# Patient Record
Sex: Female | Born: 1960 | Race: White | Hispanic: No | Marital: Married | Smoking: Never smoker
Health system: Southern US, Community
[De-identification: ages and names within clinical notes are randomized; demographics above are authoritative.]

## PROBLEM LIST (undated history)

## (undated) HISTORY — PX: ABDOMINAL HYSTERECTOMY: SHX81

---

## 2019-11-23 ENCOUNTER — Other Ambulatory Visit: Payer: Self-pay

## 2019-11-23 ENCOUNTER — Emergency Department: Payer: BC Managed Care – PPO

## 2019-11-23 ENCOUNTER — Emergency Department
Admission: EM | Admit: 2019-11-23 | Discharge: 2019-11-23 | Disposition: A | Discharge status: Home / Self Care | Payer: BC Managed Care – PPO | Attending: Student in an Organized Health Care Education/Training Program | Admitting: Student in an Organized Health Care Education/Training Program

## 2019-11-23 ENCOUNTER — Encounter: Payer: Self-pay | Admitting: Emergency Medicine

## 2019-11-23 DIAGNOSIS — U071 COVID-19: Secondary | ICD-10-CM | POA: Diagnosis not present

## 2019-11-23 DIAGNOSIS — R05 Cough: Secondary | ICD-10-CM | POA: Diagnosis present

## 2019-11-23 HISTORY — DX: COVID-19: U07.1

## 2019-11-23 LAB — POC SARS CORONAVIRUS 2 AG: SARS Coronavirus 2 Ag: POSITIVE — AB

## 2019-11-23 MED ORDER — PSEUDOEPH-BROMPHEN-DM 30-2-10 MG/5ML PO SYRP
5.0000 mL | ORAL_SOLUTION | Freq: Four times a day (QID) | ORAL | 0 refills | Status: DC | PRN
Start: 2019-11-23 — End: 2020-02-28

## 2019-11-23 MED ORDER — AZITHROMYCIN 250 MG PO TABS
ORAL_TABLET | ORAL | 0 refills | Status: AC
Start: 2019-11-23 — End: 2019-11-28

## 2019-11-23 MED ORDER — METHYLPREDNISOLONE 4 MG PO TBPK
ORAL_TABLET | ORAL | 0 refills | Status: DC
Start: 2019-11-23 — End: 2020-02-28

## 2019-11-23 NOTE — ED Notes (Signed)
While walking patient o2 was 93-95. Reported to provider.

## 2019-11-23 NOTE — ED Notes (Signed)
This RN clicked off covid swab as collected while in room. Pt then refused when she realized it wasn't the rapid swab stating "I don't want that stuck way up in my nose". Explained that the 2hr test is more accurate but pt continued to refuse. PA states to hold on swab until results of CXR back. Will swab depending on the results per Ron PA.

## 2019-11-23 NOTE — ED Notes (Signed)
CXR staff to bedside.

## 2019-11-23 NOTE — ED Provider Notes (Signed)
[facility] Emergency Department Provider Note   ____________________________________________   First MD Initiated Contact with Patient 11/23/19 1320     (approximate)  I have reviewed the triage vital signs and the nursing notes.   HISTORY  Chief Complaint Cough    HPI Kelly Kent is a 59 y.o. female patient states increasing fatigue and cough.  Patient's spouse was recently released from the hospital secondary to COVID-19 pneumonia.  Patient states she has not been diagnosed with Covid.  Patient denies loss of taste or smell.  Patient denies sore throat.  Patient denies recent travel.  Patient has not taken Covid vaccines.          History reviewed. No pertinent past medical history.  There are no problems to display for this patient.   Past Surgical History:  Procedure Laterality Date  . ABDOMINAL HYSTERECTOMY      Prior to Admission medications   Medication Sig Start Date End Date Taking? Authorizing Provider  azithromycin (ZITHROMAX Z-PAK) 250 MG tablet Take 2 tablets (500 mg) on  Day 1,  followed by 1 tablet (250 mg) once daily on Days 2 through 5. 11/23/19 11/28/19  Sable Feil, PA-C  brompheniramine-pseudoephedrine-DM 30-2-10 MG/5ML syrup Take 5 mLs by mouth 4 (four) times daily as needed. 11/23/19   Sable Feil, PA-C  methylPREDNISolone (MEDROL DOSEPAK) 4 MG TBPK tablet Take Tapered dose as directed 11/23/19   Sable Feil, PA-C    Allergies Penicillins and Neomycin  No family history on file.  Social History Social History   Tobacco Use  . Smoking status: Never Smoker  Substance Use Topics  . Alcohol use: Yes    Comment: occasional  . Drug use: Never    Review of Systems Constitutional: Fever/chills.  Fatigue.   Eyes: No visual changes. ENT: No sore throat. Cardiovascular: Denies chest pain. Respiratory: Denies shortness of breath.  Nonproductive cough. Gastrointestinal: No abdominal pain.  No nausea, no  vomiting.  No diarrhea.  No constipation. Genitourinary: Negative for dysuria. Musculoskeletal: Negative for back pain. Skin: Negative for rash. Neurological: Negative for headaches, focal weakness or numbness. Allergic/Immunilogical: Penicillin and neomycin. ____________________________________________   PHYSICAL EXAM:  VITAL SIGNS: ED Triage Vitals  Enc Vitals Group     BP 11/23/19 1259 112/90     Pulse Rate 11/23/19 1259 95     Resp 11/23/19 1259 16     Temp 11/23/19 1259 100 F (37.8 C)     Temp Source 11/23/19 1259 Oral     SpO2 11/23/19 1259 96 %     Weight 11/23/19 1300 240 lb (108.9 kg)     Height 11/23/19 1300 5\' 9"  (1.753 m)     Head Circumference --      Peak Flow --      Pain Score 11/23/19 1300 0     Pain Loc --      Pain Edu? --      Excl. in [place]? --     Constitutional: Alert and oriented. Well appearing and in no acute distress. Eyes: Conjunctivae are normal. PERRL. EOMI. Head: Atraumatic. Nose: No congestion/rhinnorhea. Mouth/Throat: Mucous membranes are moist.  Oropharynx non-erythematous. Neck: No stridor.  Hematological/Lymphatic/Immunilogical: No cervical lymphadenopathy. Cardiovascular: Normal rate, regular rhythm. Grossly normal heart sounds.  Good peripheral circulation. Respiratory: Normal respiratory effort.  No retractions. Lungs CTAB. Genitourinary: Deferred Skin:  Skin is warm, dry and intact. No rash noted. Psychiatric: Mood and affect are normal. Speech and behavior are normal.  ____________________________________________  LABS (all labs ordered are listed, but only abnormal results are displayed)  Labs Reviewed  POC SARS CORONAVIRUS 2 AG - Abnormal; Notable for the following components:      Result Value   SARS Coronavirus 2 Ag POSITIVE (*)    All other components within normal limits  RESPIRATORY PANEL BY RT PCR (FLU A&B, COVID)    ____________________________________________  EKG   ____________________________________________  RADIOLOGY  ED MD interpretation:    Official radiology report(s): DG Chest Portable 1 View  Result Date: 11/23/2019 CLINICAL DATA:  Cough, fever EXAM: PORTABLE CHEST 1 VIEW COMPARISON:  None. FINDINGS: The heart size and mediastinal contours are within normal limits. Diffuse bilateral interstitial prominence with subtle hazy opacity within the peripheral aspects of the left lung. No pleural effusion. No pneumothorax. The visualized skeletal structures are unremarkable. IMPRESSION: Diffuse bilateral interstitial prominence with subtle hazy opacity within the peripheral aspects of the left lung. Findings could reflect atypical/viral pneumonia including COVID-19. Electronically Signed   By: Duanne Guess D.O.   On: 11/23/2019 13:42    ____________________________________________   PROCEDURES  Procedure(s) performed (including Critical Care):  Procedures   ____________________________________________   INITIAL IMPRESSION / ASSESSMENT AND PLAN / ED COURSE  As part of my medical decision making, I reviewed the following data within the electronic MEDICAL RECORD NUMBER     Patient complain of few days of worsening fatigue and cough.  Patient exposure to COVID-19 from spouse who was released from the hospital last week.  Discussed chest x-ray finding consistent with atypical pneumonia which is found in COVID-19.  Patient COVID-19 test was positive.  Patient given discharge care instructions.  Patient advised self quarantine for the next 10 days.  Advised to take medication as directed.  Return to ED if condition worsens.   Kelly Kent was evaluated in Emergency Department on 11/23/2019 for the symptoms described in the history of present illness. She was evaluated in the context of the global COVID-19 pandemic, which necessitated consideration that the patient might be at risk for infection  with the SARS-CoV-2 virus that causes COVID-19. Institutional protocols and algorithms that pertain to the evaluation of patients at risk for COVID-19 are in a state of rapid change based on information released by regulatory bodies including the CDC and federal and state organizations. These policies and algorithms were followed during the patient's care in the ED.       ____________________________________________   FINAL CLINICAL IMPRESSION(S) / ED DIAGNOSES  Final diagnoses:  COVID-19 virus infection     ED Discharge Orders         Ordered    methylPREDNISolone (MEDROL DOSEPAK) 4 MG TBPK tablet     11/23/19 1451    brompheniramine-pseudoephedrine-DM 30-2-10 MG/5ML syrup  4 times daily PRN     11/23/19 1451    azithromycin (ZITHROMAX Z-PAK) 250 MG tablet     11/23/19 1451           Note:  This document was prepared using Dragon voice recognition software and may include unintentional dictation errors.    Joni Reining, PA-C 11/23/19 1456    Willy Eddy, MD 11/23/19 1504

## 2019-11-23 NOTE — ED Triage Notes (Signed)
First Nurse Note:  Arrives c/o worsening fatigue and cough.  States spouse is COVID positive and was just discharged from hosptial.  Patietn is AAOx3.  Skin warm and dry.  No SOB/ DOE.  NAD

## 2019-11-23 NOTE — ED Notes (Signed)
See triage note presents with cough  States her husband has recently dx with COVID  She has been fatigued and has had a cough  Low grade temp on arrival

## 2019-11-23 NOTE — Discharge Instructions (Addendum)
Read and follow discharge care instruction.  Take medication as directed.  Advised extra strength Tylenol 650 mg every 4 to 6 hours for fever.  Return back to ED if condition worsens.  Advised self quarantine for the next 10 days.

## 2019-11-23 NOTE — ED Triage Notes (Signed)
Patient reports her husband was recently discharged from hospital after a stay for COVID pneumonia. Patient states she has not been diagnosed with COVID, but reports since he has been home, she has had increasing fatigue and cough.

## 2019-11-24 ENCOUNTER — Telehealth: Payer: Self-pay | Admitting: Unknown Physician Specialty

## 2019-11-24 NOTE — Telephone Encounter (Signed)
Called to discuss with patient about Covid symptoms and the use of bamlanivimab, a monoclonal antibody infusion for those with mild to moderate Covid symptoms and at a high risk of hospitalization.  Pt is qualified for this infusion at the [facility] due to BMI>35   Message left to call back  

## 2020-02-22 ENCOUNTER — Telehealth (INDEPENDENT_AMBULATORY_CARE_PROVIDER_SITE_OTHER): Payer: Self-pay | Admitting: Gastroenterology

## 2020-02-22 ENCOUNTER — Other Ambulatory Visit: Payer: Self-pay

## 2020-02-22 DIAGNOSIS — Z1211 Encounter for screening for malignant neoplasm of colon: Secondary | ICD-10-CM

## 2020-02-22 DIAGNOSIS — Z800 Family history of malignant neoplasm of digestive organs: Secondary | ICD-10-CM

## 2020-02-22 NOTE — Progress Notes (Signed)
Gastroenterology Pre-Procedure Review  Request Date: Thursday 03/02/20 Requesting Physician: Dr. Servando Snare  PATIENT REVIEW QUESTIONS: The patient responded to the following health history questions as indicated:    1. Are you having any GI issues? no 2. Do you have a personal history of Polyps? yes (9 years ago) 3. Do you have a family history of Colon Cancer or Polyps? yes (brother had colon cancer, another brother had colon polyps) 4. Diabetes Mellitus? no 5. Joint replacements in the past 12 months?no 6. Major health problems in the past 3 months?no 7. Any artificial heart valves, MVP, or defibrillator?no    MEDICATIONS & ALLERGIES:    Patient reports the following regarding taking any anticoagulation/antiplatelet therapy:   Plavix, Coumadin, Eliquis, Xarelto, Lovenox, Pradaxa, Brilinta, or Effient? no Aspirin? no  Patient confirms/reports the following medications:  Current Outpatient Medications  Medication Sig Dispense Refill  . ascorbic acid (VITAMIN C) 500 MG tablet Take by mouth.    . NON FORMULARY Iodine po    . OVER THE COUNTER MEDICATION Pollen Block for allergies    . Probiotic Product (PROBIOTIC PO) Take by mouth.    . zinc gluconate 50 MG tablet Take by mouth.    . brompheniramine-pseudoephedrine-DM 30-2-10 MG/5ML syrup Take 5 mLs by mouth 4 (four) times daily as needed. (Patient not taking: Reported on 02/22/2020) 120 mL 0  . Cholecalciferol 125 MCG (5000 UT) TABS Take by mouth. (Patient not taking: Reported on 02/22/2020)    . methylPREDNISolone (MEDROL DOSEPAK) 4 MG TBPK tablet Take Tapered dose as directed (Patient not taking: Reported on 02/22/2020) 21 tablet 0   No current facility-administered medications for this visit.    Patient confirms/reports the following allergies:  Allergies  Allergen Reactions  . Penicillins Anaphylaxis  . Neomycin Hives    Orders Placed This Encounter  Procedures  . Procedural/ Surgical Case Request: COLONOSCOPY WITH PROPOFOL     Standing Status:   Standing    Number of Occurrences:   1    Order Specific Question:   Pre-op diagnosis    Answer:   screening colonoscopy, family history of colon cancer    Order Specific Question:   CPT Code    Answer:   [phone]    AUTHORIZATION INFORMATION Primary Insurance: 1D#: Group #:  Secondary Insurance: 1D#: Group #:  SCHEDULE INFORMATION: Date: Thursday 03/02/20 Time: Location:[facility]

## 2020-02-28 ENCOUNTER — Encounter: Payer: Self-pay | Admitting: Anesthesiology

## 2020-02-28 ENCOUNTER — Other Ambulatory Visit: Payer: Self-pay

## 2020-02-28 ENCOUNTER — Encounter: Payer: Self-pay | Admitting: Gastroenterology

## 2020-02-28 MED ORDER — SUTAB 1479-225-188 MG PO TABS: 12.0000 | ORAL_TABLET | Freq: Two times a day (BID) | ORAL | 0 refills | Status: AC

## 2020-02-29 ENCOUNTER — Other Ambulatory Visit
Admission: RE | Admit: 2020-02-29 | Discharge: 2020-02-29 | Disposition: A | Discharge status: Home / Self Care | Payer: BC Managed Care – PPO | Source: Ambulatory Visit | Attending: Gastroenterology | Admitting: Gastroenterology

## 2020-02-29 NOTE — Pre-Procedure Instructions (Signed)
Patient arrived for Covid testing but refused to complete the process unless it was an oral swab.  She stated that the nasopharyngeal swab was inaccurate.  Suggested that she speak to her physician because she was instructed that her procedure would be cancelled without this test.

## 2020-03-02 ENCOUNTER — Ambulatory Visit
Admission: RE | Admit: 2020-03-02 | Payer: BC Managed Care – PPO | Source: Home / Self Care | Admitting: Gastroenterology

## 2020-03-02 SURGERY — COLONOSCOPY WITH PROPOFOL
Anesthesia: Choice

## 2020-12-08 IMAGING — DX DG CHEST 1V PORT
1 series · 1 of 1 positions shown · non-contrast
Comparison: None.

CLINICAL DATA: Cough, fever

EXAM:
PORTABLE CHEST 1 VIEW

[chest ap]
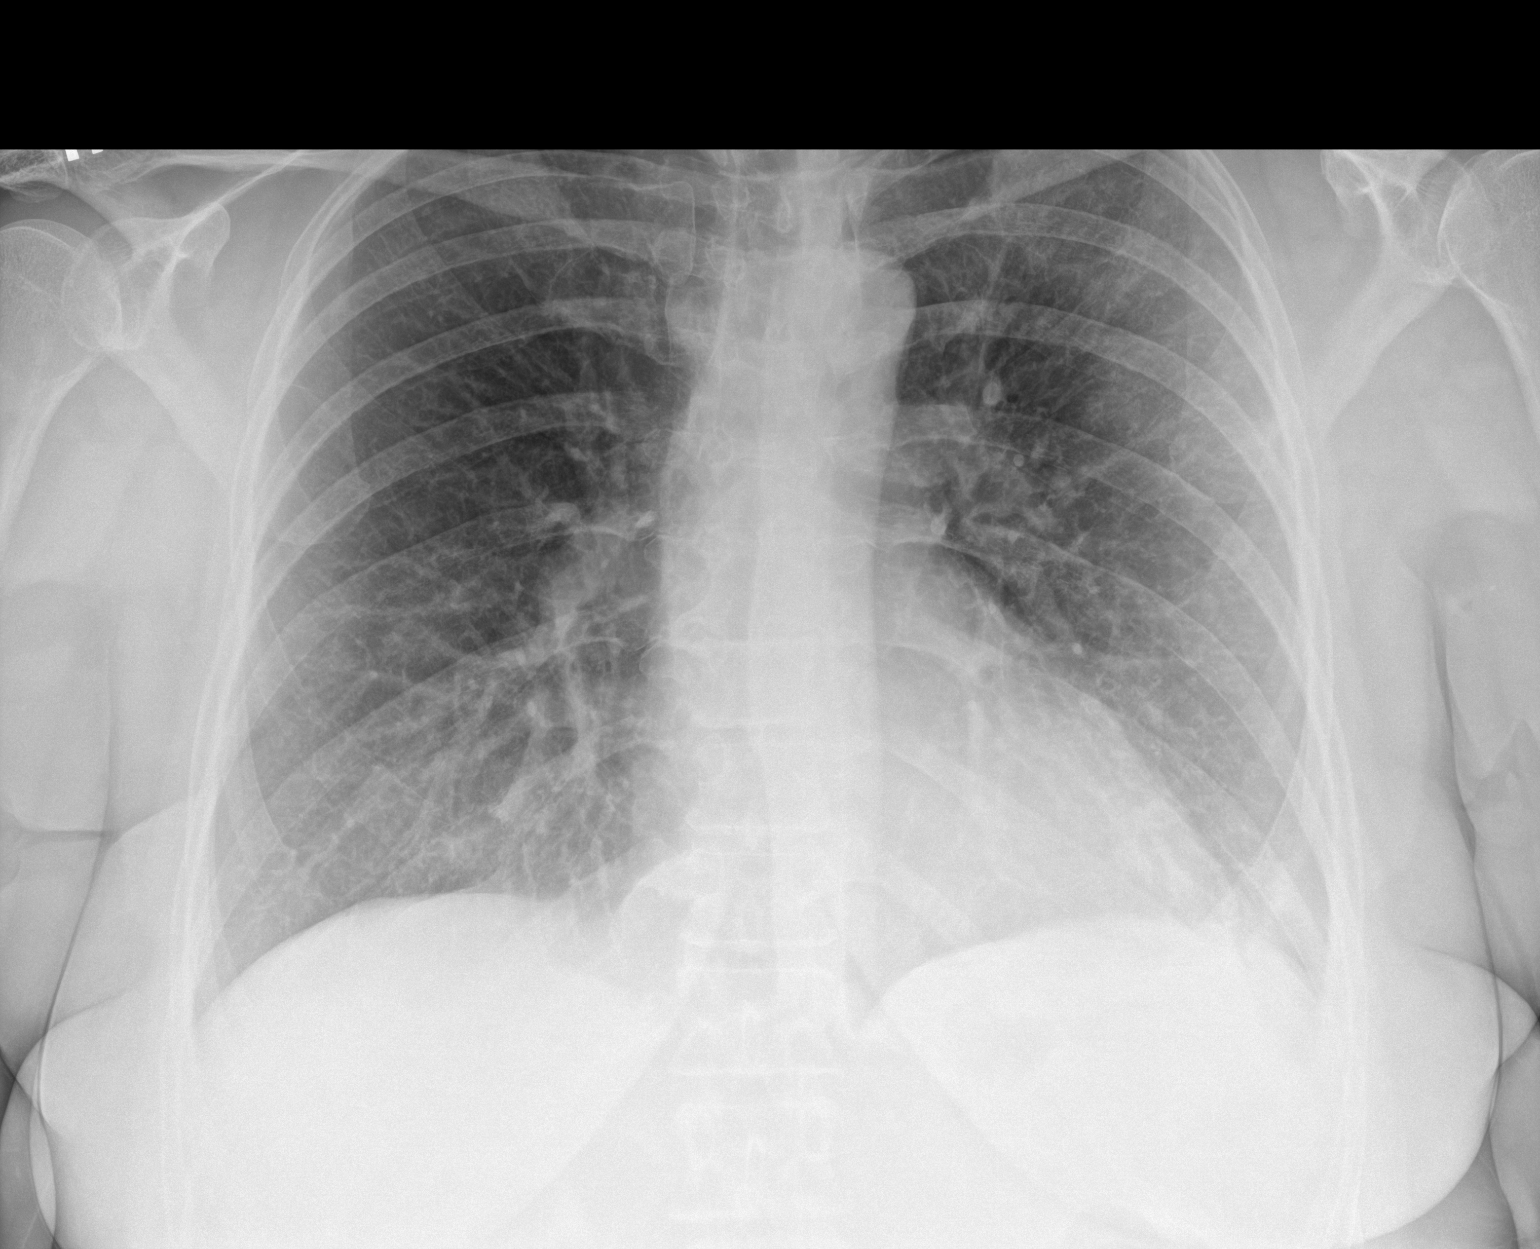

[1 of 1 positions shown; findings below may reference images not displayed]

FINDINGS: The heart size and mediastinal contours are within normal limits.
Diffuse bilateral interstitial prominence with subtle hazy opacity
within the peripheral aspects of the left lung. No pleural effusion.
No pneumothorax. The visualized skeletal structures are
unremarkable.
IMPRESSION: Diffuse bilateral interstitial prominence with subtle hazy opacity
within the peripheral aspects of the left lung. Findings could
reflect atypical/viral pneumonia including [id number].
